# Patient Record
Sex: Female | Born: 1975 | Race: Black or African American | Hispanic: No | Marital: Single | Smoking: Current every day smoker
Health system: Southern US, Community
[De-identification: ages and names within clinical notes are randomized; demographics above are authoritative.]

## PROBLEM LIST (undated history)

## (undated) DIAGNOSIS — E119 Type 2 diabetes mellitus without complications: Secondary | ICD-10-CM

## (undated) DIAGNOSIS — I10 Essential (primary) hypertension: Secondary | ICD-10-CM

---

## 2013-05-04 ENCOUNTER — Emergency Department (HOSPITAL_COMMUNITY): Payer: Self-pay

## 2013-05-04 ENCOUNTER — Encounter (HOSPITAL_COMMUNITY): Payer: Self-pay | Admitting: Emergency Medicine

## 2013-05-04 ENCOUNTER — Inpatient Hospital Stay (HOSPITAL_COMMUNITY)
Admission: EM | Admit: 2013-05-04 | Discharge: 2013-05-08 | DRG: 871 | Disposition: A | Discharge status: Home / Self Care | Payer: Self-pay | Attending: Internal Medicine | Admitting: Internal Medicine

## 2013-05-04 DIAGNOSIS — L0231 Cutaneous abscess of buttock: Secondary | ICD-10-CM | POA: Diagnosis present

## 2013-05-04 DIAGNOSIS — D72829 Elevated white blood cell count, unspecified: Secondary | ICD-10-CM | POA: Diagnosis present

## 2013-05-04 DIAGNOSIS — A419 Sepsis, unspecified organism: Principal | ICD-10-CM | POA: Diagnosis present

## 2013-05-04 DIAGNOSIS — L02415 Cutaneous abscess of right lower limb: Secondary | ICD-10-CM

## 2013-05-04 DIAGNOSIS — R509 Fever, unspecified: Secondary | ICD-10-CM

## 2013-05-04 DIAGNOSIS — R739 Hyperglycemia, unspecified: Secondary | ICD-10-CM

## 2013-05-04 DIAGNOSIS — L03317 Cellulitis of buttock: Secondary | ICD-10-CM

## 2013-05-04 DIAGNOSIS — I10 Essential (primary) hypertension: Secondary | ICD-10-CM | POA: Diagnosis present

## 2013-05-04 DIAGNOSIS — E1110 Type 2 diabetes mellitus with ketoacidosis without coma: Secondary | ICD-10-CM | POA: Diagnosis present

## 2013-05-04 DIAGNOSIS — Z794 Long term (current) use of insulin: Secondary | ICD-10-CM

## 2013-05-04 DIAGNOSIS — F17200 Nicotine dependence, unspecified, uncomplicated: Secondary | ICD-10-CM | POA: Diagnosis present

## 2013-05-04 DIAGNOSIS — E119 Type 2 diabetes mellitus without complications: Secondary | ICD-10-CM

## 2013-05-04 DIAGNOSIS — E1010 Type 1 diabetes mellitus with ketoacidosis without coma: Secondary | ICD-10-CM | POA: Diagnosis present

## 2013-05-04 HISTORY — DX: Type 2 diabetes mellitus without complications: E11.9

## 2013-05-04 HISTORY — DX: Essential (primary) hypertension: I10

## 2013-05-04 MED ORDER — ACETAMINOPHEN 650 MG RE SUPP: 650.0000 mg | RECTAL | Status: DC | PRN

## 2013-05-04 MED ORDER — CLINDAMYCIN PHOSPHATE 900 MG/50ML IV SOLN
900.0000 mg | Freq: Once | INTRAVENOUS | Status: AC
Start: 2013-05-04 — End: 2013-05-05
  Administered 2013-05-05: 900 mg via INTRAVENOUS
  Filled 2013-05-04: qty 50

## 2013-05-04 MED ORDER — ACETAMINOPHEN 325 MG PO TABS
650.0000 mg | ORAL_TABLET | Freq: Four times a day (QID) | ORAL | Status: DC | PRN
  Administered 2013-05-04 – 2013-05-08 (×4): 650 mg via ORAL
  Filled 2013-05-04 (×4): qty 2

## 2013-05-04 MED ORDER — ACETAMINOPHEN 325 MG PO TABS
650.0000 mg | ORAL_TABLET | Freq: Once | ORAL | Status: AC
  Administered 2013-05-05: 650 mg via ORAL
  Filled 2013-05-04: qty 2

## 2013-05-04 MED ORDER — SODIUM CHLORIDE 0.9 % IV BOLUS (SEPSIS)
1000.0000 mL | Freq: Once | INTRAVENOUS | Status: AC
  Administered 2013-05-05: 1000 mL via INTRAVENOUS

## 2013-05-04 NOTE — ED Provider Notes (Signed)
CSN: [id number]     Arrival date & time 05/04/13  1804 History  This chart was scribed for non-physician practitioner Ivonne Andrew, PA-C working with Juliet Rude. Rubin Payor, MD by Danella Maiers, ED Scribe. This patient was seen in room WTR5/WTR5 and the patient's care was started at 10:11 PM.    Chief Complaint  Patient presents with  . Abscess   The history is provided by the patient. No language interpreter was used.   HPI Comments: [name] Ross is a 38 y.o. female with a h/o HTN and DM who presents to the Emergency Department complaining of a gradually-worsening abscess to her right buttocks onset one week ago. She states she picked it with a pin and squeezed it yesterday and it became worse. She reports drainage from the area. She reports fever, chills, and diaphoresis since yesterday. She is here visiting from the [place].   Past Medical History  Diagnosis Date  . Diabetes mellitus without complication   . Hypertension    History reviewed. No pertinent past surgical history. History reviewed. No pertinent family history. History  Substance Use Topics  . Smoking status: Current Every Day Smoker    Types: Cigarettes  . Smokeless tobacco: Not on file  . Alcohol Use: No   OB History   Grav Para Term Preterm Abortions TAB SAB Ect Mult Living                 Review of Systems  Constitutional: Positive for fever, chills and diaphoresis.  All other systems reviewed and are negative.     Allergies  Review of patient's allergies indicates no known allergies.  Home Medications   Current Outpatient Rx  Name  Route  Sig  Dispense  Refill  . insulin glargine (LANTUS) 100 UNIT/ML injection   Subcutaneous   Inject 40 Units into the skin daily.         . insulin lispro (HUMALOG) 100 UNIT/ML injection   Subcutaneous   Inject 10 Units into the skin 2 (two) times daily.          BP 157/71  Pulse 121  Temp(Src) 103.2 F (39.6 C) (Oral)  Resp 22  SpO2 98% Physical Exam   Nursing note and vitals reviewed. Constitutional: She is oriented to person, place, and time. She appears well-developed and well-nourished.  HENT:  Head: Normocephalic and atraumatic.  Eyes: Conjunctivae and EOM are normal. Pupils are equal, round, and reactive to light.  Neck: Neck supple. No tracheal deviation present.  Cardiovascular: Tachycardia present.   Pulmonary/Chest: Effort normal. No respiratory distress. She has no wheezes. She has no rales.  Abdominal:  Obese  Musculoskeletal: Normal range of motion.  Neurological: She is alert and oriented to person, place, and time.  Skin: Skin is warm and dry.  Large area of induration and tenderness to the posterior right thigh. There is a pinpoint area of bleeding and purulent drainage.  Psychiatric: She has a normal mood and affect. Her behavior is normal.    ED Course  Procedures Medications  acetaminophen (TYLENOL) tablet 650 mg (650 mg Oral Given 05/04/13 1911)   DIAGNOSTIC STUDIES: Oxygen Saturation is 98% on RA, normal by my interpretation.    COORDINATION OF CARE: 10:20 PM- Discussed treatment plan with pt which includes I&D. Pt agrees to plan.  Patient is a very difficult IV and blood draw stick.  Labs show elevated WBC. Elevated blood sugar. Anion gap 17.  Spoke with [facility]. They will see patient admitted.  INCISION AND DRAINAGE Performed by: Angus Seller Consent: Verbal consent obtained. Risks and benefits: risks, benefits and alternatives were discussed Type: abscess  Body area: Right posterior thigh  Anesthesia: local infiltration  Incision was made with a scalpel.  Local anesthetic: lidocaine 2% with epinephrine  Anesthetic total: 7 ml  Complexity: complex Blunt dissection to break up loculations  Drainage: purulent  Drainage amount: 10 cc  Packing material: 1/4 in iodoform gauze  Patient tolerance: Patient tolerated the procedure well with no immediate  complications.       Results for orders placed during the hospital encounter of 05/04/13  CBC WITH DIFFERENTIAL      Result Value Ref Range   WBC 16.6 (*) 4.0 - 10.5 K/uL   RBC 4.33  3.87 - 5.11 MIL/uL   Hemoglobin 12.4  12.0 - 15.0 g/dL   HCT 56.2  13.0 - 86.5 %   MCV 83.1  78.0 - 100.0 fL   MCH 28.6  26.0 - 34.0 pg   MCHC 34.4  30.0 - 36.0 g/dL   RDW 78.4  69.6 - 29.5 %   Platelets 367  150 - 400 K/uL   Neutrophils Relative % 74  43 - 77 %   Lymphocytes Relative 16  12 - 46 %   Monocytes Relative 10  3 - 12 %   Eosinophils Relative 0  0 - 5 %   Basophils Relative 0  0 - 1 %   Neutro Abs 12.2 (*) 1.7 - 7.7 K/uL   Lymphs Abs 2.7  0.7 - 4.0 K/uL   Monocytes Absolute 1.7 (*) 0.1 - 1.0 K/uL   Eosinophils Absolute 0.0  0.0 - 0.7 K/uL   Basophils Absolute 0.0  0.0 - 0.1 K/uL   WBC Morphology MILD LEFT SHIFT (1-5% METAS, OCC MYELO, OCC BANDS)     Smear Review LARGE PLATELETS PRESENT    BASIC METABOLIC PANEL      Result Value Ref Range   Sodium 129 (*) 137 - 147 mEq/L   Potassium 4.2  3.7 - 5.3 mEq/L   Chloride 89 (*) 96 - 112 mEq/L   CO2 23  19 - 32 mEq/L   Glucose, Bld 512 (*) 70 - 99 mg/dL   BUN 17  6 - 23 mg/dL   Creatinine, Ser 2.84  0.50 - 1.10 mg/dL   Calcium 9.8  8.4 - 13.2 mg/dL   GFR calc non Af Amer 75 (*) >90 mL/min   GFR calc Af Amer 87 (*) >90 mL/min  CBG MONITORING, ED      Result Value Ref Range   Glucose-Capillary 393 (*) 70 - 99 mg/dL   Comment 1 Notify RN        Imaging Review Ct Pelvis W Contrast  05/05/2013   CLINICAL DATA:  Right thigh abscess.  EXAM: CT PELVIS WITH CONTRAST  TECHNIQUE: Multidetector CT imaging of the pelvis was performed using the standard protocol following the bolus administration of intravenous contrast.  CONTRAST:  100 cc of Omnipaque 300  COMPARISON:  None.  FINDINGS: Right posterior femur subcutaneous fat stranding and thickening, suggested recent incision and drainage with 10 x 11 mm focus of subcutaneous hyperdensity (2.7 cm  below the skin surface), no rim enhancing fluid collections (axial 57/62). Minimal subcutaneous gas along the tract. Extensive surrounding reticulated fat. No subfascial extent. Pelvic musculature, including proximal femur vasculature is unremarkable.  Included view of the pelvic structures are unremarkable, urinary bladder is well distended containing a small amount of  excreted contrast. Osseous structures are unremarkable, no destructive bony lesions.  IMPRESSION: Status post apparent right posterior femur soft tissue recent incision and drainage with 10 mm focus of density within the subcutaneous fat which may reflect blood products or packing material. Recommend correlation with procedural history. No abscess. Surrounding inflamed subcutaneous fat and skin thickening may reflect cellulitis.   Electronically Signed   By: Awilda Metroourtnay  Bloomer   On: 05/05/2013 01:53     MDM   Final diagnoses:  Abscess of right thigh  Fever  Hyperglycemia    I personally performed the services described in this documentation, which was scribed in my presence. The recorded information has been reviewed and is accurate.    Angus SellerPeter S Rebekah Zackery, PA-C 05/05/13 [phone]

## 2013-05-04 NOTE — ED Notes (Signed)
Pt states that she has had an abscess on her buttocks x 1 wk.

## 2013-05-05 ENCOUNTER — Emergency Department (HOSPITAL_COMMUNITY): Payer: Self-pay

## 2013-05-05 ENCOUNTER — Encounter (HOSPITAL_COMMUNITY): Payer: Self-pay

## 2013-05-05 DIAGNOSIS — E1110 Type 2 diabetes mellitus with ketoacidosis without coma: Secondary | ICD-10-CM

## 2013-05-05 DIAGNOSIS — R509 Fever, unspecified: Secondary | ICD-10-CM

## 2013-05-05 DIAGNOSIS — I10 Essential (primary) hypertension: Secondary | ICD-10-CM | POA: Diagnosis present

## 2013-05-05 DIAGNOSIS — L03317 Cellulitis of buttock: Secondary | ICD-10-CM | POA: Diagnosis present

## 2013-05-05 DIAGNOSIS — E119 Type 2 diabetes mellitus without complications: Secondary | ICD-10-CM | POA: Diagnosis present

## 2013-05-05 DIAGNOSIS — L0231 Cutaneous abscess of buttock: Secondary | ICD-10-CM | POA: Diagnosis present

## 2013-05-05 LAB — BASIC METABOLIC PANEL
BUN: 17 mg/dL (ref 6–23)
BUN: 15 mg/dL (ref 6–23)
BUN: 12 mg/dL (ref 6–23)
BUN: 14 mg/dL (ref 6–23)
BUN: 13 mg/dL (ref 6–23)
CALCIUM: 9 mg/dL (ref 8.4–10.5)
CHLORIDE: 99 meq/L (ref 96–112)
CHLORIDE: 89 meq/L — AB (ref 96–112)
CO2: 23 mEq/L (ref 19–32)
CO2: 23 meq/L (ref 19–32)
CO2: 25 meq/L (ref 19–32)
CO2: 23 mEq/L (ref 19–32)
CO2: 25 mEq/L (ref 19–32)
CREATININE: 0.68 mg/dL (ref 0.50–1.10)
CREATININE: 0.65 mg/dL (ref 0.50–1.10)
Calcium: 9.8 mg/dL (ref 8.4–10.5)
Calcium: 9 mg/dL (ref 8.4–10.5)
Calcium: 8.8 mg/dL (ref 8.4–10.5)
Calcium: 8.7 mg/dL (ref 8.4–10.5)
Chloride: 96 mEq/L (ref 96–112)
Chloride: 101 mEq/L (ref 96–112)
Chloride: 101 mEq/L (ref 96–112)
Creatinine, Ser: 0.96 mg/dL (ref 0.50–1.10)
Creatinine, Ser: 0.77 mg/dL (ref 0.50–1.10)
Creatinine, Ser: 0.62 mg/dL (ref 0.50–1.10)
GFR calc Af Amer: >90 mL/min (ref >90)
GFR calc Af Amer: >90 mL/min (ref >90)
GFR calc Af Amer: >90 mL/min (ref >90)
GFR calc Af Amer: >90 mL/min (ref >90)
GFR calc non Af Amer: 75 mL/min — ABNORMAL LOW (ref >90)
GFR calc non Af Amer: >90 mL/min (ref >90)
GFR calc non Af Amer: >90 mL/min (ref >90)
GFR calc non Af Amer: >90 mL/min (ref >90)
GFR, EST AFRICAN AMERICAN: 87 mL/min — AB (ref >90)
GLUCOSE: 357 mg/dL — AB (ref 70–99)
GLUCOSE: 253 mg/dL — AB (ref 70–99)
Glucose, Bld: 512 mg/dL — ABNORMAL HIGH (ref 70–99)
Glucose, Bld: 438 mg/dL — ABNORMAL HIGH (ref 70–99)
Glucose, Bld: 305 mg/dL — ABNORMAL HIGH (ref 70–99)
POTASSIUM: 3.5 meq/L — AB (ref 3.7–5.3)
POTASSIUM: 3.6 meq/L — AB (ref 3.7–5.3)
Potassium: 4.2 mEq/L (ref 3.7–5.3)
Potassium: 3.6 mEq/L — ABNORMAL LOW (ref 3.7–5.3)
Potassium: 3.7 mEq/L (ref 3.7–5.3)
SODIUM: 133 meq/L — AB (ref 137–147)
SODIUM: 136 meq/L — AB (ref 137–147)
Sodium: 129 mEq/L — ABNORMAL LOW (ref 137–147)
Sodium: 135 mEq/L — ABNORMAL LOW (ref 137–147)
Sodium: 135 mEq/L — ABNORMAL LOW (ref 137–147)

## 2013-05-05 LAB — CBC
HCT: 30.5 % — ABNORMAL LOW (ref 36.0–46.0)
Hemoglobin: 10.1 g/dL — ABNORMAL LOW (ref 12.0–15.0)
MCH: 27.6 pg (ref 26.0–34.0)
MCHC: 33.1 g/dL (ref 30.0–36.0)
MCV: 83.3 fL (ref 78.0–100.0)
Platelets: 319 10*3/uL (ref 150–400)
RBC: 3.66 MIL/uL — ABNORMAL LOW (ref 3.87–5.11)
RDW: 13.5 % (ref 11.5–15.5)
WBC: 14.9 10*3/uL — AB (ref 4.0–10.5)

## 2013-05-05 LAB — GLUCOSE, CAPILLARY
GLUCOSE-CAPILLARY: 132 mg/dL — AB (ref 70–99)
Glucose-Capillary: 406 mg/dL — ABNORMAL HIGH (ref 70–99)
Glucose-Capillary: 350 mg/dL — ABNORMAL HIGH (ref 70–99)
Glucose-Capillary: 258 mg/dL — ABNORMAL HIGH (ref 70–99)
Glucose-Capillary: 194 mg/dL — ABNORMAL HIGH (ref 70–99)
Glucose-Capillary: 153 mg/dL — ABNORMAL HIGH (ref 70–99)

## 2013-05-05 LAB — CBC WITH DIFFERENTIAL/PLATELET
BASOS ABS: 0 10*3/uL (ref 0.0–0.1)
Basophils Relative: 0 % (ref 0–1)
EOS PCT: 0 % (ref 0–5)
Eosinophils Absolute: 0 10*3/uL (ref 0.0–0.7)
HCT: 36 % (ref 36.0–46.0)
Hemoglobin: 12.4 g/dL (ref 12.0–15.0)
Lymphocytes Relative: 16 % (ref 12–46)
Lymphs Abs: 2.7 10*3/uL (ref 0.7–4.0)
MCH: 28.6 pg (ref 26.0–34.0)
MCHC: 34.4 g/dL (ref 30.0–36.0)
MCV: 83.1 fL (ref 78.0–100.0)
MONO ABS: 1.7 10*3/uL — AB (ref 0.1–1.0)
Monocytes Relative: 10 % (ref 3–12)
Neutro Abs: 12.2 10*3/uL — ABNORMAL HIGH (ref 1.7–7.7)
Neutrophils Relative %: 74 % (ref 43–77)
Platelets: 367 10*3/uL (ref 150–400)
RBC: 4.33 MIL/uL (ref 3.87–5.11)
RDW: 13.6 % (ref 11.5–15.5)
WBC: 16.6 10*3/uL — ABNORMAL HIGH (ref 4.0–10.5)

## 2013-05-05 LAB — HEMOGLOBIN A1C
Hgb A1c MFr Bld: 15.3 % — ABNORMAL HIGH (ref <5.7)
Mean Plasma Glucose: 392 mg/dL — ABNORMAL HIGH (ref <117)

## 2013-05-05 LAB — I-STAT CG4 LACTIC ACID, ED: Lactic Acid, Venous: 3.66 mmol/L — ABNORMAL HIGH (ref 0.5–2.2)

## 2013-05-05 LAB — CBG MONITORING, ED: Glucose-Capillary: 393 mg/dL — ABNORMAL HIGH (ref 70–99)

## 2013-05-05 MED ORDER — SODIUM CHLORIDE 0.9 % IV BOLUS (SEPSIS): 1000.0000 mL | Freq: Once | INTRAVENOUS | Status: DC

## 2013-05-05 MED ORDER — DEXTROSE 50 % IV SOLN: 25.0000 mL | INTRAVENOUS | Status: DC | PRN

## 2013-05-05 MED ORDER — INSULIN ASPART 100 UNIT/ML ~~LOC~~ SOLN
10.0000 [IU] | Freq: Once | SUBCUTANEOUS | Status: AC
  Administered 2013-05-05: 10 [IU] via SUBCUTANEOUS

## 2013-05-05 MED ORDER — INSULIN GLARGINE 100 UNIT/ML ~~LOC~~ SOLN
40.0000 [IU] | Freq: Every day | SUBCUTANEOUS | Status: DC
  Filled 2013-05-05: qty 0.4

## 2013-05-05 MED ORDER — INSULIN ASPART 100 UNIT/ML ~~LOC~~ SOLN
0.0000 [IU] | SUBCUTANEOUS | Status: DC
  Administered 2013-05-05: 1 [IU] via SUBCUTANEOUS
  Administered 2013-05-05: 2 [IU] via SUBCUTANEOUS
  Administered 2013-05-05: 5 [IU] via SUBCUTANEOUS
  Administered 2013-05-05: 2 [IU] via SUBCUTANEOUS
  Administered 2013-05-06: 3 [IU] via SUBCUTANEOUS
  Administered 2013-05-06 (×2): 2 [IU] via SUBCUTANEOUS
  Administered 2013-05-06: 1 [IU] via SUBCUTANEOUS
  Administered 2013-05-06 – 2013-05-07 (×5): 2 [IU] via SUBCUTANEOUS
  Administered 2013-05-08: 1 [IU] via SUBCUTANEOUS

## 2013-05-05 MED ORDER — OXYCODONE-ACETAMINOPHEN 5-325 MG PO TABS
1.0000 | ORAL_TABLET | ORAL | Status: DC | PRN
  Administered 2013-05-05 – 2013-05-06 (×5): 1 via ORAL
  Filled 2013-05-05 (×5): qty 1

## 2013-05-05 MED ORDER — DEXTROSE-NACL 5-0.45 % IV SOLN: INTRAVENOUS | Status: DC

## 2013-05-05 MED ORDER — POTASSIUM CHLORIDE 10 MEQ/100ML IV SOLN
10.0000 meq | INTRAVENOUS | Status: AC
  Administered 2013-05-05 (×2): 10 meq via INTRAVENOUS
  Filled 2013-05-05 (×2): qty 100

## 2013-05-05 MED ORDER — POTASSIUM CHLORIDE CRYS ER 20 MEQ PO TBCR
40.0000 meq | EXTENDED_RELEASE_TABLET | Freq: Two times a day (BID) | ORAL | Status: AC
  Administered 2013-05-05 (×2): 40 meq via ORAL
  Filled 2013-05-05 (×2): qty 2

## 2013-05-05 MED ORDER — INSULIN GLARGINE 100 UNIT/ML ~~LOC~~ SOLN
40.0000 [IU] | Freq: Every day | SUBCUTANEOUS | Status: DC
  Administered 2013-05-05 – 2013-05-08 (×4): 40 [IU] via SUBCUTANEOUS
  Filled 2013-05-05 (×4): qty 0.4

## 2013-05-05 MED ORDER — IOHEXOL 300 MG/ML  SOLN
100.0000 mL | Freq: Once | INTRAMUSCULAR | Status: AC | PRN
  Administered 2013-05-05: 100 mL via INTRAVENOUS

## 2013-05-05 MED ORDER — SODIUM CHLORIDE 0.9 % IV SOLN
INTRAVENOUS | Status: DC
  Administered 2013-05-05 – 2013-05-06 (×5): via INTRAVENOUS

## 2013-05-05 MED ORDER — OXYCODONE-ACETAMINOPHEN 5-325 MG PO TABS
2.0000 | ORAL_TABLET | Freq: Once | ORAL | Status: AC
  Administered 2013-05-05: 2 via ORAL
  Filled 2013-05-05: qty 2

## 2013-05-05 MED ORDER — ENOXAPARIN SODIUM 40 MG/0.4ML ~~LOC~~ SOLN
40.0000 mg | SUBCUTANEOUS | Status: DC
  Administered 2013-05-05 – 2013-05-07 (×3): 40 mg via SUBCUTANEOUS
  Filled 2013-05-05 (×4): qty 0.4

## 2013-05-05 MED ORDER — VANCOMYCIN HCL IN DEXTROSE 1-5 GM/200ML-% IV SOLN
1000.0000 mg | Freq: Three times a day (TID) | INTRAVENOUS | Status: DC
  Administered 2013-05-05 – 2013-05-08 (×10): 1000 mg via INTRAVENOUS
  Filled 2013-05-05 (×12): qty 200

## 2013-05-05 NOTE — H&P (Signed)
Chief Complaint:  Pain to rt buttock  HPI: 38 yo female h/o type 1 dm (dx 15 years ago), htn comes in with one week of worsening pain and swelling to rt lower buttock/posterior thigh area.  No drainage.  Several days of fevers and chills.  She is visiting from [place].  No recent abx.  Found to have an absess in area which has been drained by ED staff.    Review of Systems:  Positive and negative as per HPI otherwise all other systems are negative  Past Medical History: Past Medical History  Diagnosis Date  . Diabetes mellitus without complication   . Hypertension    History reviewed. No pertinent past surgical history.  Medications: Prior to Admission medications   Medication Sig Start Date End Date Taking? Authorizing Provider  insulin glargine (LANTUS) 100 UNIT/ML injection Inject 40 Units into the skin daily.   Yes Historical Provider, MD  insulin lispro (HUMALOG) 100 UNIT/ML injection Inject 10 Units into the skin 2 (two) times daily.   Yes Historical Provider, MD    Allergies:  No Known Allergies  Social History:  reports that she has been smoking Cigarettes.  She has been smoking about 0.00 packs per day. She does not have any smokeless tobacco history on file. She reports that she does not drink alcohol or use illicit drugs.  Family History: History reviewed. No pertinent family history.  Physical Exam: Filed Vitals:   05/04/13 1901 05/05/13 0039  BP: 157/71 173/83  Pulse: 121 82  Temp: 103.2 F (39.6 C) 100.9 F (38.3 C)  TempSrc: Oral Oral  Resp: 22 14  SpO2: 98% 96%   General appearance: alert, cooperative and no distress Head: Normocephalic, without obvious abnormality, atraumatic Eyes: negative Nose: Nares normal. Septum midline. Mucosa normal. No drainage or sinus tenderness. Neck: no JVD and supple, symmetrical, trachea midline Lungs: clear to auscultation bilaterally Heart: regular rate and rhythm, S1, S2 normal, no murmur, click, rub or  gallop Abdomen: soft, non-tender; bowel sounds normal; no masses,  no organomegaly Extremities: extremities normal, atraumatic, no cyanosis or edema  absess to rt post lower butttock area s/p i&d Pulses: 2+ and symmetric Skin: Skin color, texture, turgor normal. No rashes or lesions Neurologic: Grossly normal    Labs on Admission:   Recent Labs  05/05/13 0025  NA 129*  K 4.2  CL 89*  CO2 23  GLUCOSE 512*  BUN 17  CREATININE 0.96  CALCIUM 9.8    Recent Labs  05/05/13 0025  WBC 16.6*  NEUTROABS 12.2*  HGB 12.4  HCT 36.0  MCV 83.1  PLT 367   Radiological Exams on Admission: Ct Pelvis W Contrast  05/05/2013   CLINICAL DATA:  Right thigh abscess.  EXAM: CT PELVIS WITH CONTRAST  TECHNIQUE: Multidetector CT imaging of the pelvis was performed using the standard protocol following the bolus administration of intravenous contrast.  CONTRAST:  100 cc of Omnipaque 300  COMPARISON:  None.  FINDINGS: Right posterior femur subcutaneous fat stranding and thickening, suggested recent incision and drainage with 10 x 11 mm focus of subcutaneous hyperdensity (2.7 cm below the skin surface), no rim enhancing fluid collections (axial 57/62). Minimal subcutaneous gas along the tract. Extensive surrounding reticulated fat. No subfascial extent. Pelvic musculature, including proximal femur vasculature is unremarkable.  Included view of the pelvic structures are unremarkable, urinary bladder is well distended containing a small amount of excreted contrast. Osseous structures are unremarkable, no destructive bony lesions.  IMPRESSION: Status post apparent right  posterior femur soft tissue recent incision and drainage with 10 mm focus of density within the subcutaneous fat which may reflect blood products or packing material. Recommend correlation with procedural history. No abscess. Surrounding inflamed subcutaneous fat and skin thickening may reflect cellulitis.   Electronically Signed   By: Awilda Metro   On: 05/05/2013 01:53    Assessment/Plan  38 yo female with absess and mild dka  Principal Problem:   Cellulitis and abscess of buttock-  Iv vancomycin.  Active Problems:   Diabetes mellitus   DKA (diabetic ketoacidoses)  Insulin gtt overnight with freq bmp cks.  Likely from above infection.   Hypertension    Rachal A David 05/05/2013, 2:43 AM

## 2013-05-05 NOTE — ED Notes (Signed)
CG4 Lactic acid given to Dr. Norlene Campbelltter

## 2013-05-05 NOTE — Progress Notes (Signed)
ANTIBIOTIC CONSULT NOTE - INITIAL  Pharmacy Consult for vancomycin Indication: cellulitis  No Known Allergies  Patient Measurements: Height: 5\' 3"  (160 cm) Weight: 219 lb 5.7 oz (99.5 kg) IBW/kg (Calculated) : 52.4 Adjusted Body Weight:   Vital Signs: Temp: 98.7 F (37.1 C) (04/09 0441) Temp src: Oral (04/09 0441) BP: 148/82 mmHg (04/09 0441) Pulse Rate: 95 (04/09 0441) Intake/Output from previous day:   Intake/Output from this shift:    Labs:  Recent Labs  05/05/13 0025 05/05/13 0305  WBC 16.6*  --   HGB 12.4  --   PLT 367  --   CREATININE 0.96 0.77   Estimated Creatinine Clearance: 108.2 ml/min (by C-G formula based on Cr of 0.77). No results found for this basename: VANCOTROUGH, VANCOPEAK, VANCORANDOM, GENTTROUGH, GENTPEAK, GENTRANDOM, TOBRATROUGH, TOBRAPEAK, TOBRARND, AMIKACINPEAK, AMIKACINTROU, AMIKACIN,  in the last 72 hours   Microbiology: No results found for this or any previous visit (from the past 720 hour(s)).  Medical History: Past Medical History  Diagnosis Date  . Diabetes mellitus without complication   . Hypertension     Medications:  Anti-infectives   Start     Dose/Rate Route Frequency Ordered Stop   05/05/13 0600  vancomycin (VANCOCIN) IVPB 1000 mg/200 mL premix     1,000 mg 200 mL/hr over 60 Minutes Intravenous Every 8 hours 05/05/13 0511     05/04/13 2245  clindamycin (CLEOCIN) IVPB 900 mg     900 mg 100 mL/hr over 30 Minutes Intravenous  Once 05/04/13 2240 05/05/13 0137     Assessment: Patient with cellulitis.    Goal of Therapy:  Vancomycin trough level 10-15 mcg/ml  Plan:  Measure antibiotic drug levels at steady state Follow up culture results Vancomycin 1gm iv q8hr  [employer] Specialty ChemicalsJulian Crowford Yamin Swingler Jr. 05/05/2013,5:11 AM

## 2013-05-05 NOTE — ED Notes (Signed)
Notified RN, Autumn pt. CBG 393.

## 2013-05-05 NOTE — Progress Notes (Signed)
UR completed. Patient changed to inpatient- requiring IVF @ 125cc/hr and IV antibiotics.  

## 2013-05-05 NOTE — Progress Notes (Signed)
Inpatient Diabetes Program Recommendations  AACE/ADA: New Consensus Statement on Inpatient Glycemic Control (2013)  Target Ranges:  Prepandial:   less than 140 mg/dL      Peak postprandial:   less than 180 mg/dL (1-2 hours)      Critically ill patients:  140 - 180 mg/dL  Results for [name], [name] (MRN [id number]) as of 05/05/2013 10:43  Ref. Range 05/05/2013 02:42 05/05/2013 05:04 05/05/2013 06:08 05/05/2013 07:38  Glucose-Capillary Latest Range: 70-99 mg/dL 409393 (H) 811406 (H) 914350 (H) 258 (H)   Inpatient Diabetes Program Recommendations HgbA1C: pending  Currently ordered home dose of Lantus 40 units. Dose given this morning.   Will follow.  Thank you  Piedad ClimesGina Noe Goyer BSN, RN,CDE Inpatient Diabetes Coordinator [phone] (team pager)

## 2013-05-05 NOTE — Progress Notes (Signed)
[facility] PROGRESS NOTE Assessment/Plan: Cellulitis and abscess of buttock - IV vancomycin started on 4.9.2015. - Fevers overnight. Leukocytosis improving.  Diabetes mellitus - Start Lantus now plus SSI. No AG HCO3 >20. - Bg high showed improved with insulin treatment.    Hypertension - Bp improved. From yesterday    Code Status: full Family Communication: none  Disposition Plan: inpatinet   Consultants:  none  Procedures:  CT abd and pelvis  Antibiotics:  IV vanc 4.9.2015  HPI/Subjective: Still tired and thirsty  Objective: Filed Vitals:   05/04/13 1901 05/05/13 0039 05/05/13 0353 05/05/13 0441  BP: 157/71 173/83 139/67 148/82  Pulse: 121 82 97 95  Temp: 103.2 F (39.6 C) 100.9 F (38.3 C) 98.8 F (37.1 C) 98.7 F (37.1 C)  TempSrc: Oral Oral Oral Oral  Resp: 22 14 18 20   Height:    5\' 3"  (1.6 m)  Weight:    99.5 kg (219 lb 5.7 oz)  SpO2: 98% 96% 94% 100%    Intake/Output Summary (Last 24 hours) at 05/05/13 0735 Last data filed at 05/05/13 3086  Gross per 24 hour  Intake    320 ml  Output      0 ml  Net    320 ml   Filed Weights   05/05/13 0441  Weight: 99.5 kg (219 lb 5.7 oz)    Exam:  General: Alert, awake, oriented x3, in no acute distress.  HEENT: No bruits, no goiter.  Heart: Regular rate and rhythm, without murmurs. Lungs: Good air movement, clear Abdomen: Soft, nontender, nondistended, positive bowel sounds.     Data Reviewed: Basic Metabolic Panel:  Recent Labs Lab 05/05/13 0025 05/05/13 0305 05/05/13 0635  NA 129* 133* 135*  K 4.2 3.5* 3.6*  CL 89* 96 99  CO2 23 23 23   GLUCOSE 512* 438* 357*  BUN 17 15 14   CREATININE 0.96 0.77 0.68  CALCIUM 9.8 9.0 9.0   Liver Function Tests: No results found for this basename: AST, ALT, ALKPHOS, BILITOT, PROT, ALBUMIN,  in the last 168 hours No results found for this basename: LIPASE, AMYLASE,  in the last 168 hours No results found for this basename: AMMONIA,  in  the last 168 hours CBC:  Recent Labs Lab 05/05/13 0025 05/05/13 0635  WBC 16.6* 14.9*  NEUTROABS 12.2*  --   HGB 12.4 10.1*  HCT 36.0 30.5*  MCV 83.1 83.3  PLT 367 319   Cardiac Enzymes: No results found for this basename: CKTOTAL, CKMB, CKMBINDEX, TROPONINI,  in the last 168 hours BNP (last 3 results) No results found for this basename: PROBNP,  in the last 8760 hours CBG:  Recent Labs Lab 05/05/13 0242 05/05/13 0504 05/05/13 0608  GLUCAP 393* 406* 350*    No results found for this or any previous visit (from the past 240 hour(s)).   Studies: Ct Pelvis W Contrast  05/05/2013   CLINICAL DATA:  Right thigh abscess.  EXAM: CT PELVIS WITH CONTRAST  TECHNIQUE: Multidetector CT imaging of the pelvis was performed using the standard protocol following the bolus administration of intravenous contrast.  CONTRAST:  100 cc of Omnipaque 300  COMPARISON:  None.  FINDINGS: Right posterior femur subcutaneous fat stranding and thickening, suggested recent incision and drainage with 10 x 11 mm focus of subcutaneous hyperdensity (2.7 cm below the skin surface), no rim enhancing fluid collections (axial 57/62). Minimal subcutaneous gas along the tract. Extensive surrounding reticulated fat. No subfascial extent. Pelvic musculature, including proximal femur vasculature is  unremarkable.  Included view of the pelvic structures are unremarkable, urinary bladder is well distended containing a small amount of excreted contrast. Osseous structures are unremarkable, no destructive bony lesions.  IMPRESSION: Status post apparent right posterior femur soft tissue recent incision and drainage with 10 mm focus of density within the subcutaneous fat which may reflect blood products or packing material. Recommend correlation with procedural history. No abscess. Surrounding inflamed subcutaneous fat and skin thickening may reflect cellulitis.   Electronically Signed   By: Awilda Metroourtnay  Bloomer   On: 05/05/2013 01:53     Scheduled Meds: . enoxaparin (LOVENOX) injection  40 mg Subcutaneous Q24H  . insulin aspart  0-9 Units Subcutaneous 6 times per day  . insulin glargine  40 Units Subcutaneous Daily  . sodium chloride  1,000 mL Intravenous Once  . vancomycin  1,000 mg Intravenous Q8H   Continuous Infusions: . sodium chloride 150 mL/hr at 05/05/13 0501  . dextrose 5 % and 0.45% NaCl       [name] Ross  [facility] Pager [phone]. If 8PM-8AM, please contact night-coverage at [email or web address], password [email or web address] 05/05/2013, 7:35 AM  LOS: 1 day

## 2013-05-06 DIAGNOSIS — L03119 Cellulitis of unspecified part of limb: Secondary | ICD-10-CM

## 2013-05-06 DIAGNOSIS — L02419 Cutaneous abscess of limb, unspecified: Secondary | ICD-10-CM

## 2013-05-06 DIAGNOSIS — A419 Sepsis, unspecified organism: Secondary | ICD-10-CM | POA: Diagnosis present

## 2013-05-06 LAB — GLUCOSE, CAPILLARY
GLUCOSE-CAPILLARY: 201 mg/dL — AB (ref 70–99)
GLUCOSE-CAPILLARY: 132 mg/dL — AB (ref 70–99)
Glucose-Capillary: 192 mg/dL — ABNORMAL HIGH (ref 70–99)
Glucose-Capillary: 157 mg/dL — ABNORMAL HIGH (ref 70–99)
Glucose-Capillary: 179 mg/dL — ABNORMAL HIGH (ref 70–99)
Glucose-Capillary: 180 mg/dL — ABNORMAL HIGH (ref 70–99)

## 2013-05-06 LAB — CREATININE, SERUM
Creatinine, Ser: 0.72 mg/dL (ref 0.50–1.10)
GFR calc non Af Amer: >90 mL/min (ref >90)

## 2013-05-06 LAB — VANCOMYCIN, TROUGH: VANCOMYCIN TR: 15.1 ug/mL (ref 10.0–20.0)

## 2013-05-06 MED ORDER — CEFTRIAXONE SODIUM 1 G IJ SOLR
1.0000 g | INTRAMUSCULAR | Status: DC
  Administered 2013-05-06 – 2013-05-07 (×2): 1 g via INTRAVENOUS
  Filled 2013-05-06 (×3): qty 10

## 2013-05-06 NOTE — Care Management Note (Signed)
    Page 1 of 1   05/06/2013     3:38:25 PM   CARE MANAGEMENT NOTE 05/06/2013  Patient:  [name] Ross Ross,[name] Ross   Account Number:  [id number]  Date Initiated:  05/06/2013  Documentation initiated by:  Lanier ClamMAHABIR,Rheda Kassab  Subjective/Objective Assessment:   37 Y/O F ADMITTED W/R BUTTOCK ABSCESS.     Action/Plan:   FROM [place],BUT WILL STAY IN [place] @ D/C.NO PCP.NO INSURANCE.   Anticipated DC Date:  05/10/2013   Anticipated DC Plan:  HOME/SELF CARE  In-house referral  PCP / Insurance account managerHealth Connect  Financial Counselor      DC Planning Services  CM consult  Indigent Health Clinic  Medication Assistance      Choice offered to / List presented to:             Status of service:  In process, will continue to follow Medicare Important Message given?   (If response is "NO", the following Medicare IM given date fields will be blank) Date Medicare IM given:   Date Additional Medicare IM given:    Discharge Disposition:    Per UR Regulation:  Reviewed for med. necessity/level of care/duration of stay  If discussed at Long Length of Stay Meetings, dates discussed:    Comments:  05/06/13 [facility]Y Dalylah Ramey RN,BSN NCM [phone] PROVIDED W/$4 [facility] MED LIST,MAY NEED MATCH PROGRAM-DEPENDS ON D/C MEDS.PCP LISTING-ENCOURAGED COMMUNITY CLINIC,OTHER COMMUNITY RESOURCES.

## 2013-05-06 NOTE — Progress Notes (Signed)
[facility] PROGRESS NOTE Assessment/Plan: Sepsis/Cellulitis and abscess of buttock - IV vancomycin started on 4.9.2015. Cont to have fevers, add rocpehin - Fevers overnight.   Diabetes mellitus - Start Lantus now plus SSI. No AG HCO3 >20. - Bg high showed improved with insulin treatment.    Hypertension - Bp improved. From yesterday    Code Status: full Family Communication: none  Disposition Plan: inpatinet   Consultants:  none  Procedures:  CT abd and pelvis  Antibiotics:  IV vanc 4.9.2015  HPI/Subjective: Feels better but chills overnight  Objective: Filed Vitals:   05/05/13 2225 05/06/13 0200 05/06/13 0415 05/06/13 0625  BP:  134/70 141/85   Pulse:  97 106   Temp: 99.4 F (37.4 C) 99 F (37.2 C) 98.2 F (36.8 C) 99.3 F (37.4 C)  TempSrc: Oral Oral Oral   Resp:  18 19   Height:      Weight:      SpO2:  98% 98%     Intake/Output Summary (Last 24 hours) at 05/06/13 0958 Last data filed at 05/06/13 0511  Gross per 24 hour  Intake   4585 ml  Output      0 ml  Net   4585 ml   Filed Weights   05/05/13 0441  Weight: 99.5 kg (219 lb 5.7 oz)    Exam:  General: Alert, awake, oriented x3, in no acute distress.  HEENT: No bruits, no goiter.  Heart: Regular rate and rhythm, without murmurs. Lungs: Good air movement, clear Abdomen: Soft, nontender, nondistended, positive bowel sounds.     Data Reviewed: Basic Metabolic Panel:  Recent Labs Lab 05/05/13 0025 05/05/13 0305 05/05/13 0635 05/05/13 0809 05/05/13 1040  NA 129* 133* 135* 136* 135*  K 4.2 3.5* 3.6* 3.7 3.6*  CL 89* 96 99 101 101  CO2 23 23 23 25 25   GLUCOSE 512* 438* 357* 305* 253*  BUN 17 15 14 13 12   CREATININE 0.96 0.77 0.68 0.65 0.62  CALCIUM 9.8 9.0 9.0 8.8 8.7   Liver Function Tests: No results found for this basename: AST, ALT, ALKPHOS, BILITOT, PROT, ALBUMIN,  in the last 168 hours No results found for this basename: LIPASE, AMYLASE,  in the last 168  hours No results found for this basename: AMMONIA,  in the last 168 hours CBC:  Recent Labs Lab 05/05/13 0025 05/05/13 0635  WBC 16.6* 14.9*  NEUTROABS 12.2*  --   HGB 12.4 10.1*  HCT 36.0 30.5*  MCV 83.1 83.3  PLT 367 319   Cardiac Enzymes: No results found for this basename: CKTOTAL, CKMB, CKMBINDEX, TROPONINI,  in the last 168 hours BNP (last 3 results) No results found for this basename: PROBNP,  in the last 8760 hours CBG:  Recent Labs Lab 05/05/13 1730 05/05/13 2021 05/06/13 0051 05/06/13 0411 05/06/13 0738  GLUCAP 153* 132* 192* 157* 179*    No results found for this or any previous visit (from the past 240 hour(s)).   Studies: Ct Pelvis W Contrast  05/05/2013   CLINICAL DATA:  Right thigh abscess.  EXAM: CT PELVIS WITH CONTRAST  TECHNIQUE: Multidetector CT imaging of the pelvis was performed using the standard protocol following the bolus administration of intravenous contrast.  CONTRAST:  100 cc of Omnipaque 300  COMPARISON:  None.  FINDINGS: Right posterior femur subcutaneous fat stranding and thickening, suggested recent incision and drainage with 10 x 11 mm focus of subcutaneous hyperdensity (2.7 cm below the skin surface), no rim enhancing fluid collections (  axial 57/62). Minimal subcutaneous gas along the tract. Extensive surrounding reticulated fat. No subfascial extent. Pelvic musculature, including proximal femur vasculature is unremarkable.  Included view of the pelvic structures are unremarkable, urinary bladder is well distended containing a small amount of excreted contrast. Osseous structures are unremarkable, no destructive bony lesions.  IMPRESSION: Status post apparent right posterior femur soft tissue recent incision and drainage with 10 mm focus of density within the subcutaneous fat which may reflect blood products or packing material. Recommend correlation with procedural history. No abscess. Surrounding inflamed subcutaneous fat and skin thickening may  reflect cellulitis.   Electronically Signed   By: Awilda Metroourtnay  Bloomer   On: 05/05/2013 01:53    Scheduled Meds: . enoxaparin (LOVENOX) injection  40 mg Subcutaneous Q24H  . insulin aspart  0-9 Units Subcutaneous 6 times per day  . insulin glargine  40 Units Subcutaneous Daily  . sodium chloride  1,000 mL Intravenous Once  . vancomycin  1,000 mg Intravenous Q8H   Continuous Infusions: . sodium chloride 150 mL/hr at 05/06/13 0807     [name] Ross  [facility] Pager [phone]. If 8PM-8AM, please contact night-coverage at [email or web address], password [email or web address] 05/06/2013, 9:58 AM  LOS: 2 days

## 2013-05-06 NOTE — Clinical Documentation Improvement (Signed)
Possible Clinical Conditions? Septicemia / Sepsis Severe Sepsis Septic Shock Other Condition  Cannot clinically Determine   Supporting Information: Principal Problem:   Cellulitis and abscess of buttock-  Iv vancomycin. Vitals: BP 157/71  Pulse 121  Temp(Src) 103.2 F (39.6 C) (Oral)  Resp 22  SpO2 98% Labs: 05-04-13 Lactic Acid, Venous 0.5 - 2.2 mmol/L  3.66 (H)    Thank You, Nevin BloodgoodJoan B Sheri Prows, RN, BSN, CCDS, Clinical Documentation Specialist:  [phone]   [phone]=[facility] [employer]- Health Information Management

## 2013-05-06 NOTE — Progress Notes (Signed)
ANTIBIOTIC CONSULT NOTE - Follow up  Pharmacy Consult for vancomycin Indication: cellulitis  No Known Allergies  Patient Measurements: Height: 5\' 3"  (160 cm) Weight: 219 lb 5.7 oz (99.5 kg) IBW/kg (Calculated) : 52.4  Vital Signs: Temp: 99.3 F (37.4 C) (04/10 0625) Temp src: Oral (04/10 0415) BP: 141/85 mmHg (04/10 0415) Pulse Rate: 106 (04/10 0415) Intake/Output from previous day: 04/09 0701 - 04/10 0700 In: 4705 [P.O.:1080; I.V.:3625] Out: -  Intake/Output from this shift: Total I/O In: 722.5 [I.V.:722.5] Out: -   Labs:  Recent Labs  05/05/13 0025  05/05/13 0635 05/05/13 0809 05/05/13 1040 05/06/13 1253  WBC 16.6*  --  14.9*  --   --   --   HGB 12.4  --  10.1*  --   --   --   PLT 367  --  319  --   --   --   CREATININE 0.96  < > 0.68 0.65 0.62 0.72  < > = values in this interval not displayed. Estimated Creatinine Clearance: 108.2 ml/min (by C-G formula based on Cr of 0.72).  Recent Labs  05/06/13 1253  VANCOTROUGH 15.1     Microbiology: No results found for this or any previous visit (from the past 720 hour(s)).  Medications:  Anti-infectives   Start     Dose/Rate Route Frequency Ordered Stop   05/06/13 1030  cefTRIAXone (ROCEPHIN) 1 g in dextrose 5 % 50 mL IVPB     1 g 100 mL/hr over 30 Minutes Intravenous Every 24 hours 05/06/13 1001     05/05/13 0600  vancomycin (VANCOCIN) IVPB 1000 mg/200 mL premix     1,000 mg 200 mL/hr over 60 Minutes Intravenous Every 8 hours 05/05/13 0511     05/04/13 2245  clindamycin (CLEOCIN) IVPB 900 mg     900 mg 100 mL/hr over 30 Minutes Intravenous  Once 05/04/13 2240 05/05/13 0137     Assessment: 37 yoF with 15 yr hx of DM type1 presents with abscess of R buttock/thigh. Hx of several days fever/chills and [place] from [place]. Abscess drained-no culture obtained/ordered  Vancomycin 1gm q8h begun 4/9 per pharmacy  HgbA1c of 15.3%: very poor diabetes control  Rocephin added by MD for continued fever, no CBC  today, WBC 14.9 yesterday  Vancomycin trough 15.1 mcg/ml  Goal of Therapy:  Will aim for higher trough 15-20 mcg/ml with abscess and hx of DM poorly controlled  Plan:   Continue Vancomycin 1gm q8h  Follow renal function- adjust Vanc as needed  Otho BellowsGreen, Christian Treadway L PharmD Pager [phone] 05/06/2013, 2:05 PM

## 2013-05-07 LAB — GLUCOSE, CAPILLARY
GLUCOSE-CAPILLARY: 151 mg/dL — AB (ref 70–99)
GLUCOSE-CAPILLARY: 157 mg/dL — AB (ref 70–99)
GLUCOSE-CAPILLARY: 164 mg/dL — AB (ref 70–99)
Glucose-Capillary: 140 mg/dL — ABNORMAL HIGH (ref 70–99)
Glucose-Capillary: 83 mg/dL (ref 70–99)
Glucose-Capillary: 95 mg/dL (ref 70–99)
Glucose-Capillary: 97 mg/dL (ref 70–99)

## 2013-05-07 NOTE — Progress Notes (Addendum)
   [facility] PROGRESS NOTE    Assessment/Plan:  Sepsis due to Cellulitis and abscess of buttock - IV vancomycin started on 4.9.2015. Cont to have fevers, added rocephin 4/10 - patient appreciates significant improvement today, able to sit better.  - afebrile overnight, 99.7 Tmax - CT scan pelvis without abscess    Diabetes mellitus - Start Lantus now plus SSI. No AG HCO3 >20. - Bg high showed improved with insulin treatment. - A1C 15 showing poor control     Hypertension - Bp improved. From yesterday    Code Status: full Family Communication: none  Disposition Plan: inpatinet   Consultants:  none  Procedures:  CT abd and pelvis  Antibiotics:  IV vanc 4.9.2015  HPI/Subjective: Feels better but chills overnight  Objective: Filed Vitals:   05/06/13 1419 05/06/13 2051 05/07/13 0210 05/07/13 0444  BP: 148/79 155/83 151/79 163/94  Pulse: 87 100 102 101  Temp: 98 F (36.7 C) 99.2 F (37.3 C) 99.3 F (37.4 C) 99.7 F (37.6 C)  TempSrc: Oral Oral Oral Oral  Resp: 18 20 20 20   Height:      Weight:      SpO2: 98% 95% 94% 94%    Intake/Output Summary (Last 24 hours) at 05/07/13 1014 Last data filed at 05/07/13 0900  Gross per 24 hour  Intake    760 ml  Output      0 ml  Net    760 ml   Filed Weights   05/05/13 0441  Weight: 99.5 kg (219 lb 5.7 oz)    Exam:  General: Alert, awake, oriented x3, in no acute distress.  HEENT: No bruits, no goiter.  Heart: Regular rate and rhythm, without murmurs. Lungs: Good air movement, clear Abdomen: Soft, nontender, nondistended, positive bowel sounds.     Data Reviewed: Basic Metabolic Panel:  Recent Labs Lab 05/05/13 0025 05/05/13 0305 05/05/13 0635 05/05/13 0809 05/05/13 1040 05/06/13 1253  NA 129* 133* 135* 136* 135*  --   K 4.2 3.5* 3.6* 3.7 3.6*  --   CL 89* 96 99 101 101  --   CO2 23 23 23 25 25   --   GLUCOSE 512* 438* 357* 305* 253*  --   BUN 17 15 14 13 12   --   CREATININE 0.96  0.77 0.68 0.65 0.62 0.72  CALCIUM 9.8 9.0 9.0 8.8 8.7  --    CBC:  Recent Labs Lab 05/05/13 0025 05/05/13 0635  WBC 16.6* 14.9*  NEUTROABS 12.2*  --   HGB 12.4 10.1*  HCT 36.0 30.5*  MCV 83.1 83.3  PLT 367 319   CBG:  Recent Labs Lab 05/06/13 1624 05/06/13 2047 05/07/13 0005 05/07/13 0446 05/07/13 0739  GLUCAP 180* 132* 97 83 95   Studies: No results found.  Scheduled Meds: . cefTRIAXone (ROCEPHIN)  IV  1 g Intravenous Q24H  . enoxaparin (LOVENOX) injection  40 mg Subcutaneous Q24H  . insulin aspart  0-9 Units Subcutaneous 6 times per day  . insulin glargine  40 Units Subcutaneous Daily  . sodium chloride  1,000 mL Intravenous Once  . vancomycin  1,000 mg Intravenous Q8H   Continuous Infusions: . sodium chloride Stopped (05/06/13 1000)   Time spent: 25 min  Estellar Cadena Otelia SergeantM Welford Christmas  [facility] Pager [phone]. If 7PM-7AM, please contact night-coverage at [email or web address], password [email or web address] 05/07/2013, 10:14 AM  LOS: 3 days

## 2013-05-08 LAB — CBC
HCT: 29.6 % — ABNORMAL LOW (ref 36.0–46.0)
Hemoglobin: 10 g/dL — ABNORMAL LOW (ref 12.0–15.0)
MCH: 27.9 pg (ref 26.0–34.0)
MCHC: 33.8 g/dL (ref 30.0–36.0)
MCV: 82.5 fL (ref 78.0–100.0)
PLATELETS: 368 10*3/uL (ref 150–400)
RBC: 3.59 MIL/uL — ABNORMAL LOW (ref 3.87–5.11)
RDW: 13.8 % (ref 11.5–15.5)
WBC: 8.5 10*3/uL (ref 4.0–10.5)

## 2013-05-08 LAB — GLUCOSE, CAPILLARY
GLUCOSE-CAPILLARY: 137 mg/dL — AB (ref 70–99)
Glucose-Capillary: 118 mg/dL — ABNORMAL HIGH (ref 70–99)

## 2013-05-08 MED ORDER — AMOXICILLIN-POT CLAVULANATE 875-125 MG PO TABS: 1.0000 | ORAL_TABLET | Freq: Two times a day (BID) | ORAL | Status: DC

## 2013-05-08 MED ORDER — SULFAMETHOXAZOLE-TMP DS 800-160 MG PO TABS: 1.0000 | ORAL_TABLET | Freq: Two times a day (BID) | ORAL | Status: AC

## 2013-05-08 MED ORDER — SULFAMETHOXAZOLE-TMP DS 800-160 MG PO TABS: 1.0000 | ORAL_TABLET | Freq: Two times a day (BID) | ORAL | Status: DC

## 2013-05-08 MED ORDER — AMOXICILLIN-POT CLAVULANATE 875-125 MG PO TABS: 1.0000 | ORAL_TABLET | Freq: Two times a day (BID) | ORAL | Status: AC

## 2013-05-08 NOTE — Discharge Summary (Signed)
Physician Discharge Summary  [name] Ross YNW:[id number] DOB: Oct 04, 1975 DOA: 05/04/2013  PCP: No primary provider on file.  Admit date: 05/04/2013 Discharge date: 05/08/2013  Time spent: 35 minutes  Recommendations for Outpatient Follow-up:  1. Follow up with closest urgent care in 3-4 days 2. Follow up with PCP upon return in [place]   Discharge Diagnoses:  Principal Problem:   Cellulitis and abscess of buttock Active Problems:   Diabetes mellitus   DKA (diabetic ketoacidoses)   Hypertension   Sepsis  Discharge Condition: stable  Diet recommendation: diabetic  Filed Weights   05/05/13 0441  Weight: 99.5 kg (219 lb 5.7 oz)   History of present illness:  38 yo female h/o type 1 dm (dx 15 years ago), htn comes in with one week of worsening pain and swelling to rt lower buttock/posterior thigh area. No drainage. Several days of fevers and chills. [place] is [place] from [place]. No recent abx. Found to have an absess in area which has been drained by ED staff.  Hospital Course:  Sepsis due to Cellulitis and abscess of buttock - s/p I&D in the emergency room. Patient was initially started on vancomycin, then Ceftriaxone was added. Clinically she improved with antibiotics, was afebrile and her cellulitis and tenderness improved significantly. She underwent a CT scan of her pelvis which showed no evidence of abscess. No microbiology was sent following her I&D, and her antibiotic regimen were narrowed to double coverage with Bactrim and Augmentin. She is from [place] and is visiting, but confirmed that she will go to the nearest Urgent center for a follow up visit in 3-4 days and she will see her regular PCP in 2 weeks in [place]. She will complete a 14 day total course of antibiotics.   Diabetes mellitus - A1C elevated at 15.3 showing poor control. Sugars uncontrolled on admission int he setting of infection. Once antibiotics were started, she had much better control of her CBGs and she  is to resume her previous home regimen  Procedures:  I&D in the ED   Consultations:  none  Discharge Exam: Filed Vitals:   05/07/13 1355 05/07/13 2036 05/08/13 0138 05/08/13 0426  BP: 169/94 146/77 152/84 155/89  Pulse: 97 98 90 96  Temp: 99.3 F (37.4 C) 99.1 F (37.3 C) 98.8 F (37.1 C) 98.2 F (36.8 C)  TempSrc: Oral Oral Oral Oral  Resp: 20 19 19 18   Height:      Weight:      SpO2: 96% 96% 94% 95%   General: NAD Cardiovascular: RRR Respiratory: CTA biL  Discharge Instructions     Medication List         amoxicillin-clavulanate 875-125 MG per tablet  Commonly known as:  AUGMENTIN  Take 1 tablet by mouth 2 (two) times daily.     insulin glargine 100 UNIT/ML injection  Commonly known as:  LANTUS  Inject 40 Units into the skin daily.     insulin lispro 100 UNIT/ML injection  Commonly known as:  HUMALOG  Inject 10 Units into the skin 2 (two) times daily.     sulfamethoxazole-trimethoprim 800-160 MG per tablet  Commonly known as:  BACTRIM DS  Take 1 tablet by mouth 2 (two) times daily.         The results of significant diagnostics from this hospitalization (including imaging, microbiology, ancillary and laboratory) are listed below for reference.    Significant Diagnostic Studies: Ct Pelvis W Contrast  05/05/2013   CLINICAL DATA:  Right thigh abscess.  EXAM: CT PELVIS WITH CONTRAST  TECHNIQUE: Multidetector CT imaging of the pelvis was performed using the standard protocol following the bolus administration of intravenous contrast.  CONTRAST:  100 cc of Omnipaque 300  COMPARISON:  None.  FINDINGS: Right posterior femur subcutaneous fat stranding and thickening, suggested recent incision and drainage with 10 x 11 mm focus of subcutaneous hyperdensity (2.7 cm below the skin surface), no rim enhancing fluid collections (axial 57/62). Minimal subcutaneous gas along the tract. Extensive surrounding reticulated fat. No subfascial extent. Pelvic musculature,  including proximal femur vasculature is unremarkable.  Included view of the pelvic structures are unremarkable, urinary bladder is well distended containing a small amount of excreted contrast. Osseous structures are unremarkable, no destructive bony lesions.  IMPRESSION: Status post apparent right posterior femur soft tissue recent incision and drainage with 10 mm focus of density within the subcutaneous fat which may reflect blood products or packing material. Recommend correlation with procedural history. No abscess. Surrounding inflamed subcutaneous fat and skin thickening may reflect cellulitis.   Electronically Signed   By: Awilda Metro   On: 05/05/2013 01:53   Labs: Basic Metabolic Panel:  Recent Labs Lab 05/05/13 0025 05/05/13 0305 05/05/13 0635 05/05/13 0809 05/05/13 1040 05/06/13 1253  NA 129* 133* 135* 136* 135*  --   K 4.2 3.5* 3.6* 3.7 3.6*  --   CL 89* 96 99 101 101  --   CO2 23 23 23 25 25   --   GLUCOSE 512* 438* 357* 305* 253*  --   BUN 17 15 14 13 12   --   CREATININE 0.96 0.77 0.68 0.65 0.62 0.72  CALCIUM 9.8 9.0 9.0 8.8 8.7  --    CBC:  Recent Labs Lab 05/05/13 0025 05/05/13 0635 05/08/13 0532  WBC 16.6* 14.9* 8.5  NEUTROABS 12.2*  --   --   HGB 12.4 10.1* 10.0*  HCT 36.0 30.5* 29.6*  MCV 83.1 83.3 82.5  PLT 367 319 368   CBG:  Recent Labs Lab 05/07/13 1612 05/07/13 2034 05/07/13 2345 05/08/13 0424 05/08/13 0749  GLUCAP 157* 164* 140* 118* 137*       Signed:  Costin M Gherghe  [facility] 05/08/2013, 3:23 PM

## 2013-05-08 NOTE — Discharge Instructions (Signed)
Please follow up with your regular doctor as soon as you return home to [place].  Please follow up with any Urgent care in 3-4 days to ensure your infection is improving.  Please return to the hospital if you experience fever, chills, increased pain.   Please take the antibiotics as prescribed for 12 additional days to complete a 14 day course.

## 2013-05-09 NOTE — ED Provider Notes (Signed)
Medical screening examination/treatment/procedure(s) were performed by non-physician practitioner and as supervising physician I was immediately available for consultation/collaboration.   EKG Interpretation None       Destin Kittler R. Magaret Justo, MD 05/09/13 1457 

## 2013-05-12 ENCOUNTER — Emergency Department (HOSPITAL_COMMUNITY)
Admission: EM | Admit: 2013-05-12 | Discharge: 2013-05-12 | Disposition: A | Discharge status: Home / Self Care | Payer: Self-pay | Attending: Emergency Medicine | Admitting: Emergency Medicine

## 2013-05-12 ENCOUNTER — Encounter (HOSPITAL_COMMUNITY): Payer: Self-pay | Admitting: Emergency Medicine

## 2013-05-12 DIAGNOSIS — Z4801 Encounter for change or removal of surgical wound dressing: Secondary | ICD-10-CM | POA: Insufficient documentation

## 2013-05-12 DIAGNOSIS — Z794 Long term (current) use of insulin: Secondary | ICD-10-CM | POA: Insufficient documentation

## 2013-05-12 DIAGNOSIS — Z792 Long term (current) use of antibiotics: Secondary | ICD-10-CM | POA: Insufficient documentation

## 2013-05-12 DIAGNOSIS — E119 Type 2 diabetes mellitus without complications: Secondary | ICD-10-CM | POA: Insufficient documentation

## 2013-05-12 DIAGNOSIS — F17200 Nicotine dependence, unspecified, uncomplicated: Secondary | ICD-10-CM | POA: Insufficient documentation

## 2013-05-12 DIAGNOSIS — Z09 Encounter for follow-up examination after completed treatment for conditions other than malignant neoplasm: Secondary | ICD-10-CM

## 2013-05-12 DIAGNOSIS — I10 Essential (primary) hypertension: Secondary | ICD-10-CM | POA: Insufficient documentation

## 2013-05-12 NOTE — ED Notes (Addendum)
Pt presents today with request for a wound check. Pt was seen on 05/04/13 for an abscess on the right buttocks, which was I&D and was admitted to the hospital due to fever and the necessity for IV antibiotics. Pt states she is from the [place] and thus she does not have a PCP to recheck the wound. Pt is A/O x4. Pt denies fever since being discharged from the hospital.

## 2013-05-12 NOTE — ED Provider Notes (Signed)
CSN: [id number]     Arrival date & time 05/12/13  1600 History  This chart was scribed for non-physician practitioner working with Nelia Shiobert L Beaton, MD by Ashley JacobsBrittany Andrews, ED scribe. This patient was seen in room WTR6/WTR6 and the patient's care was started at 4:25 PM.   First MD Initiated Contact with Patient 05/12/13 1607     Chief Complaint  Patient presents with  . Wound Check     (Consider location/radiation/quality/duration/timing/severity/associated sxs/prior Treatment) Patient is a 38 y.o. female presenting with wound check. The history is provided by the patient and medical records. No language interpreter was used.  Wound Check Pertinent negatives include no shortness of breath.   HPI Comments: [name] Skatesngela Stapel is a 38 y.o. female who presents to the Emergency Department for a wound check. Pt was seen 05/04/13 for an abscess and cellulitis to her right buttock that required admission to the hospital 4/8-4/12/15. She was treated with a two week Rx of Augmentin and Bactrim.She denies any complications to the site other than itching. The pain has significantly decreased. Reports great improvement. She denies profuse drainage. Pt is changing the bandages up to three times a day as instructed.   Past Medical History  Diagnosis Date  . Diabetes mellitus without complication   . Hypertension    History reviewed. No pertinent past surgical history. No family history on file. History  Substance Use Topics  . Smoking status: Current Every Day Smoker    Types: Cigarettes  . Smokeless tobacco: Not on file  . Alcohol Use: No   OB History   Grav Para Term Preterm Abortions TAB SAB Ect Mult Living                 Review of Systems  Constitutional: Negative for fever and chills.  HENT: Negative for trouble swallowing.   Respiratory: Negative for shortness of breath.   Gastrointestinal: Negative for nausea and vomiting.  Skin: Positive for wound.       Abcess to right buttock   All other systems reviewed and are negative.     Allergies  Review of patient's allergies indicates no known allergies.  Home Medications   Prior to Admission medications   Medication Sig Start Date End Date Taking? Authorizing Provider  amoxicillin-clavulanate (AUGMENTIN) 875-125 MG per tablet Take 1 tablet by mouth 2 (two) times daily. 05/08/13   Costin Otelia SergeantM Gherghe, MD  insulin glargine (LANTUS) 100 UNIT/ML injection Inject 40 Units into the skin daily.    Historical Provider, MD  insulin lispro (HUMALOG) 100 UNIT/ML injection Inject 10 Units into the skin 2 (two) times daily.    Historical Provider, MD  sulfamethoxazole-trimethoprim (BACTRIM DS) 800-160 MG per tablet Take 1 tablet by mouth 2 (two) times daily. 05/08/13   Costin Otelia SergeantM Gherghe, MD   BP 181/93  Pulse 86  Temp(Src) 98.6 F (37 C) (Oral)  Resp 18  SpO2 98%  LMP 05/05/2013 Physical Exam  Nursing note and vitals reviewed. Constitutional: She appears well-developed and well-nourished. No distress.  HENT:  Head: Normocephalic and atraumatic.  Eyes: EOM are normal. Pupils are equal, round, and reactive to light.  Neck: Normal range of motion. Neck supple. No tracheal deviation present.  Cardiovascular: Normal rate.   Pulmonary/Chest: Effort normal. No respiratory distress.  Abdominal: Soft. She exhibits no distension.  Musculoskeletal: Normal range of motion.  Skin: She is not diaphoretic. No erythema.  Open incision that is actively draining a light thin yellow discharge No overlying erythema No edema No  fluctuance No Induration The site is non tender   Psychiatric: She has a normal mood and affect. Her behavior is normal.    ED Course  Procedures (including critical care time) DIAGNOSTIC STUDIES: Oxygen Saturation is 98% on room air, normal by my interpretation.    COORDINATION OF CARE:  4:29 PM Discussed course of care with pt which includes wound care. Pt understands and agrees.    Labs Review Labs  Reviewed - No data to display  Imaging Review No results found.   EKG Interpretation None      MDM   Final diagnoses:  Encounter for recheck of abscess following incision and drainage   Afebrile nontoxic patient presenting for recheck of abscess after being admitted to hospital for same (though previously with overlying cellulitis).  Pt reporting great improvement.  Area looks like it is progressing well compared to descriptions in my chart review.  Pt to continue antibiotics as previously prescribed.  D/C home.  PCP follow up.  Discussed findings, treatment, and follow up  with patient.  Pt given return precautions.  Pt verbalizes understanding and agrees with plan.      I personally performed the services described in this documentation, which was scribed in my presence. The recorded information has been reviewed and is accurate.     Trixie Dredgemily Meng [place], PA-C 05/19/13 1555

## 2013-05-12 NOTE — Discharge Instructions (Signed)
Read the information below.  You may return to the Emergency Department at any time for worsening condition or any new symptoms that concern you.  Continue taking the antibiotics as directed until they are gone.  If you develop redness, swelling, pus draining from the wound, or fevers greater than 100.4, return to the ER immediately for a recheck.      Wound Check Your wound appears healthy today. Your wound will heal gradually over time. Eventually a scar will form that will fade with time. FACTORS THAT AFFECT SCAR FORMATION:  People differ in the severity in which they scar.  Scar severity varies according to location, size, and the traits you inherited from your parents (genetic predisposition).  Irritation to the wound from infection, rubbing, or chemical exposure will increase the amount of scar formation. HOME CARE INSTRUCTIONS   If you were given a dressing, you should change it at least once a day or as instructed by your caregiver. If the bandage sticks, soak it off with a solution of hydrogen peroxide.  If the bandage becomes wet, dirty, or develops a bad smell, change it as soon as possible.  Look for signs of infection.  Only take over-the-counter or prescription medicines for pain, discomfort, or fever as directed by your caregiver. SEEK IMMEDIATE MEDICAL CARE IF:   You have redness, swelling, or increasing pain in the wound.  You notice pus coming from the wound.  You have a fever.  You notice a bad smell coming from the wound or dressing. Document Released: 10/20/2003 Document Revised: 04/07/2011 Document Reviewed: 01/13/2005 [place] Patient Information 2014 [employer] [place].

## 2013-05-21 NOTE — ED Provider Notes (Signed)
Medical screening examination/treatment/procedure(s) were performed by non-physician practitioner and as supervising physician I was immediately available for consultation/collaboration.    Leshawn Straka L Renaye Janicki, MD 05/21/13 0959 

## 2016-01-22 IMAGING — CT CT PELVIS W/ CM
1 series · 15 of 32 positions shown, 19 images · IV contrast (omnipaque)
Comparison: None.

CLINICAL DATA: Right thigh abscess.

EXAM:
CT PELVIS WITH CONTRAST
TECHNIQUE: Multidetector CT imaging of the pelvis was performed using the
standard protocol following the bolus administration of intravenous
contrast.
CONTRAST:  100 cc of Omnipaque 300

[Series 2: pelvis with · axial · 0.92mm/px · z∈[+1019,+1299]mm · 15 of 62 slices shown, 19 images]
[im 4/62  soft-tissue]
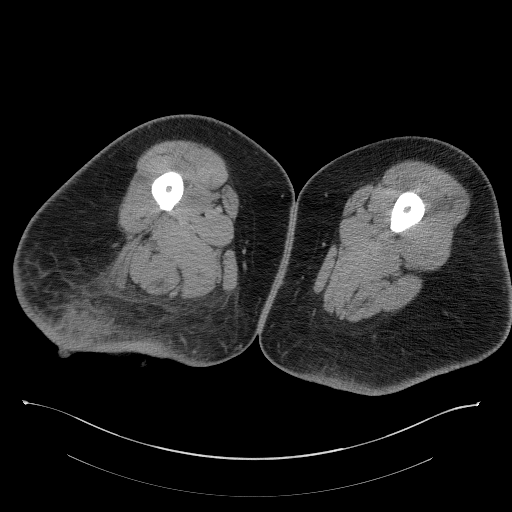
[im 4/62  bone]
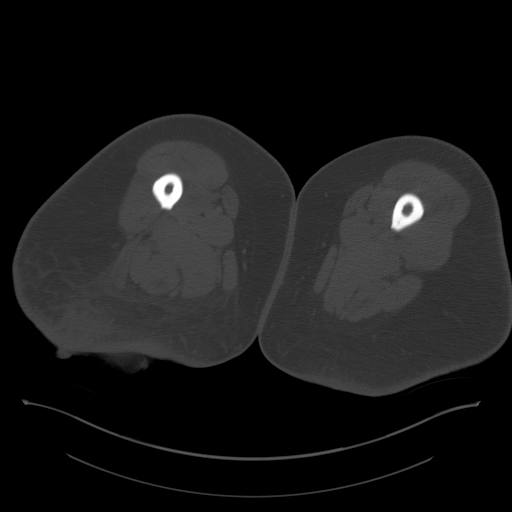
[im 8/62  soft-tissue]
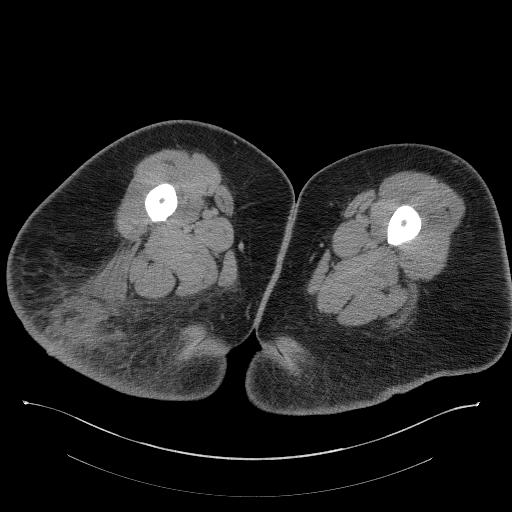
[im 12/62  soft-tissue]
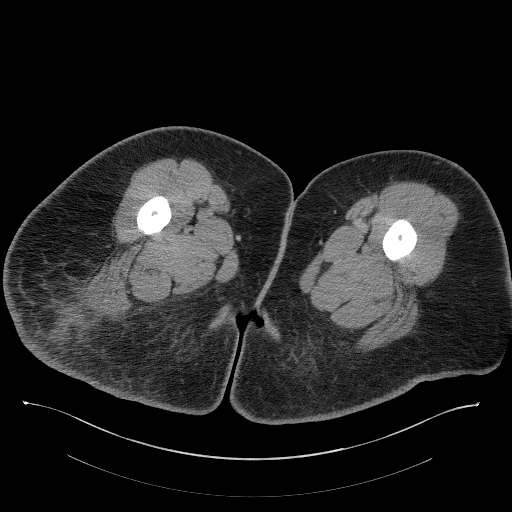
[im 18/62  soft-tissue]
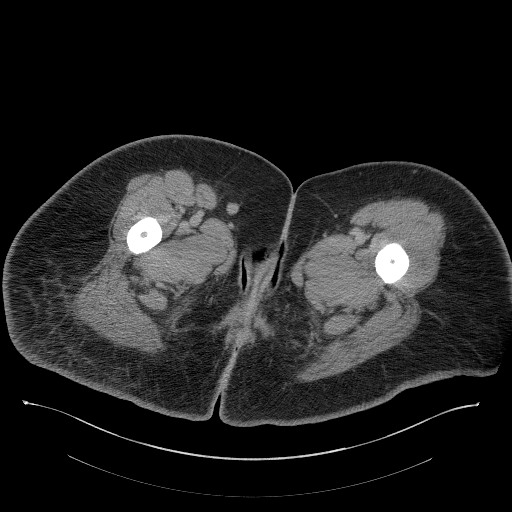
[im 22/62  soft-tissue]
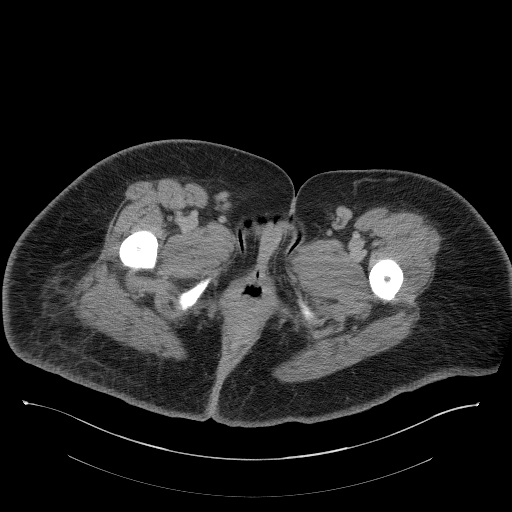
[im 26/62  soft-tissue]
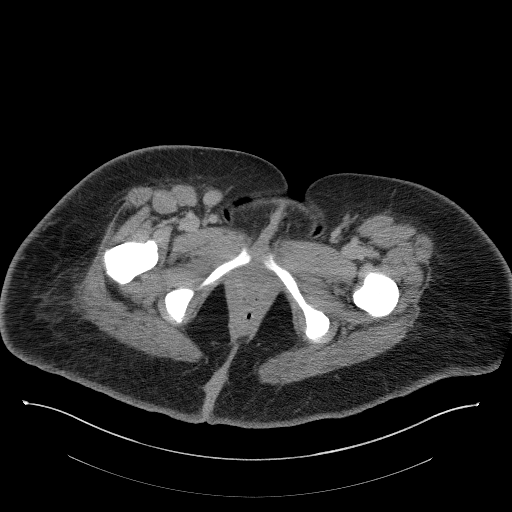
[im 32/62  soft-tissue]
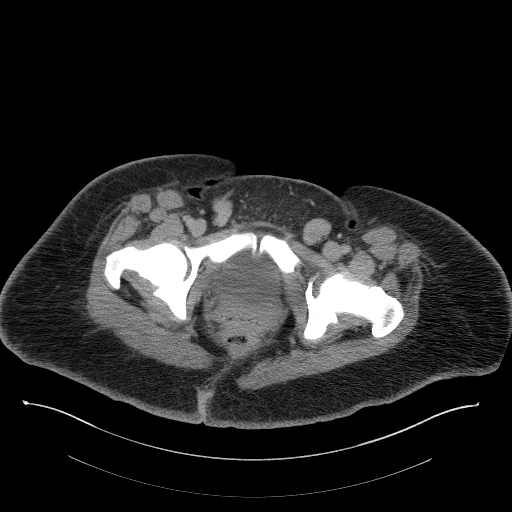
[im 36/62  soft-tissue]
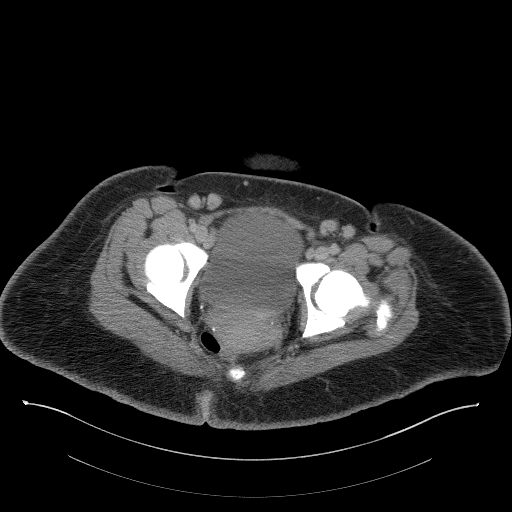
[im 40/62  soft-tissue]
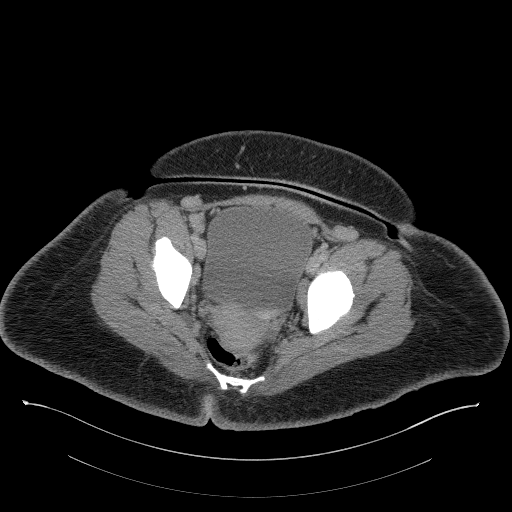
[im 40/62  bone]
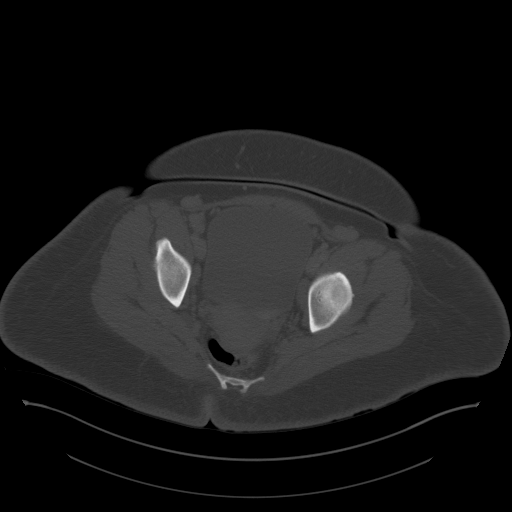
[im 44/62  soft-tissue]
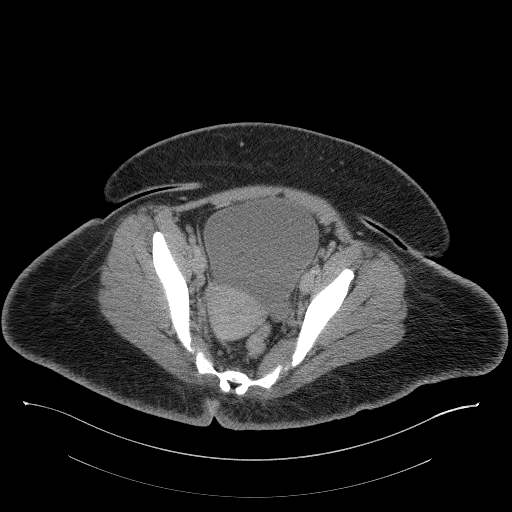
[im 50/62  soft-tissue]
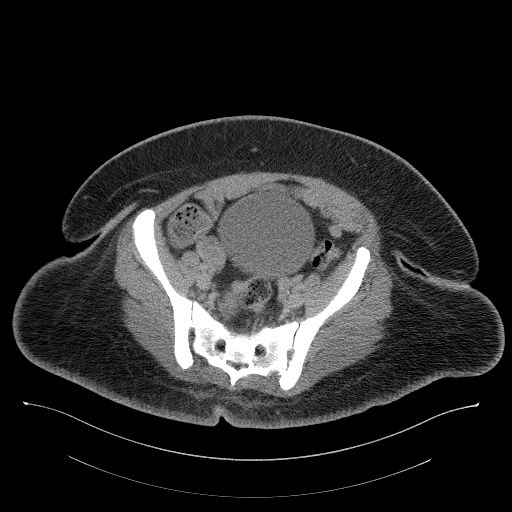
[im 54/62  soft-tissue]
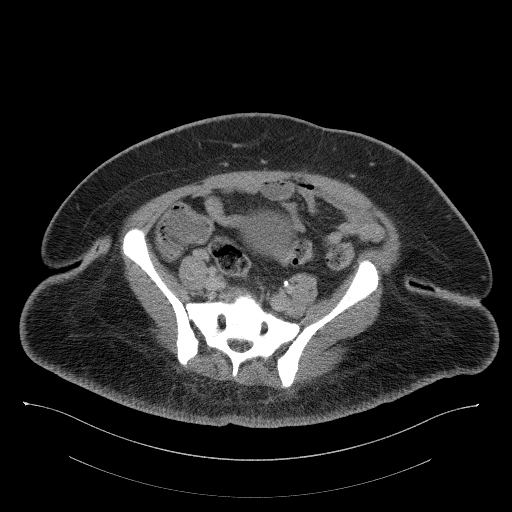
[im 54/62  lung]
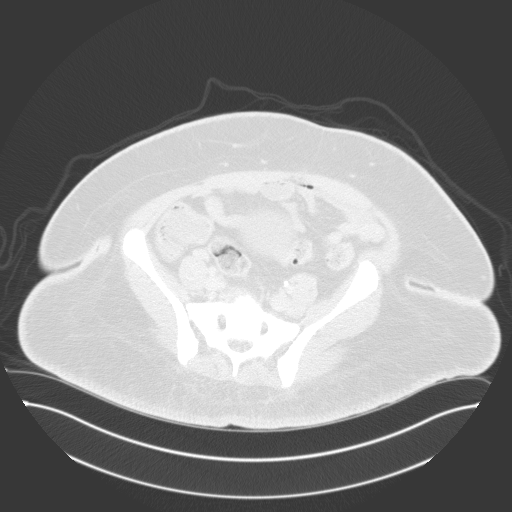
[im 56/62  lung]
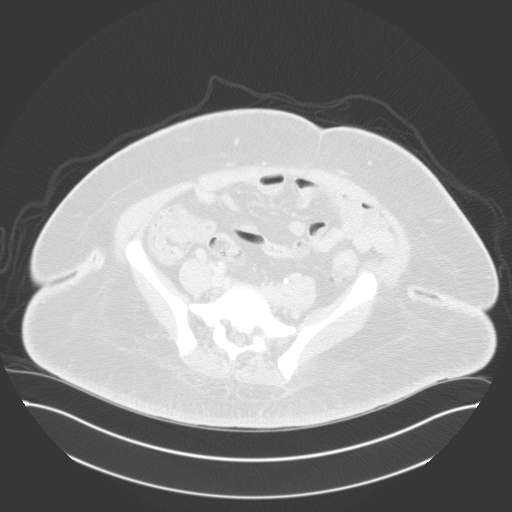
[im 58/62  soft-tissue]
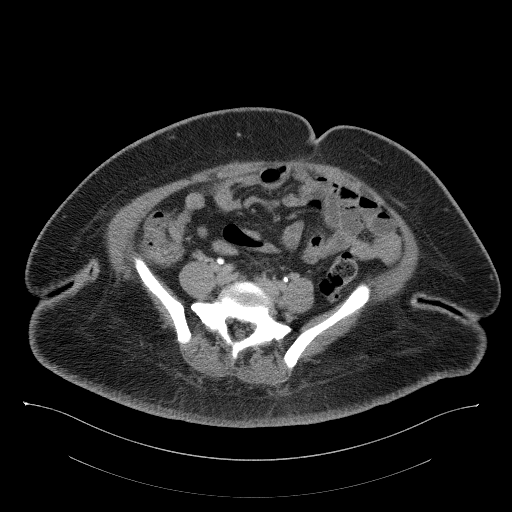
[im 58/62  lung]
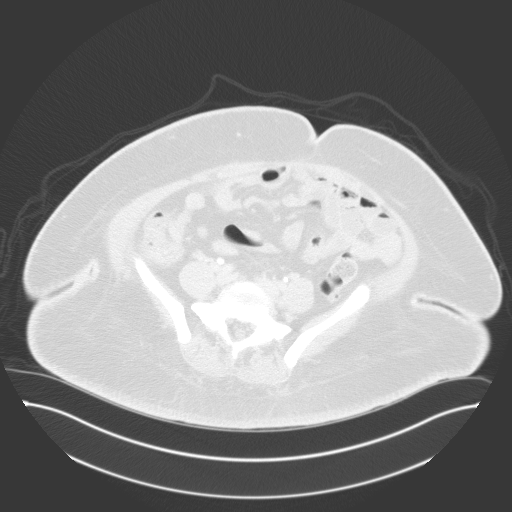
[im 60/62  lung]
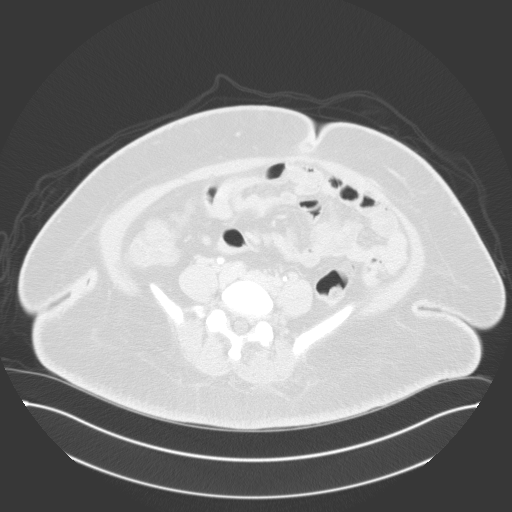

[15 of 32 positions shown; findings below may reference images not displayed]

FINDINGS: Right posterior femur subcutaneous fat stranding and thickening,
suggested recent incision and drainage with 10 x 11 mm focus of
subcutaneous hyperdensity (2.7 cm below the skin surface), no rim
enhancing fluid collections (axial 57/62). Minimal subcutaneous gas
along the tract. Extensive surrounding reticulated fat. No
subfascial extent. Pelvic musculature, including proximal femur
vasculature is unremarkable.

Included view of the pelvic structures are unremarkable, urinary
bladder is well distended containing a small amount of excreted
contrast. Osseous structures are unremarkable, no destructive bony
lesions.
IMPRESSION: Status post apparent right posterior femur soft tissue recent
incision and drainage with 10 mm focus of density within the
subcutaneous fat which may reflect blood products or packing
material. Recommend correlation with procedural history. No abscess.
Surrounding inflamed subcutaneous fat and skin thickening may
reflect cellulitis.

  By: Biyu Hotaru
